# Patient Record
Sex: Male | Born: 1970 | Hispanic: Yes | Marital: Married | Smoking: Never smoker
Health system: Southern US, Community
[De-identification: ages and names within clinical notes are randomized; demographics above are authoritative.]

## PROBLEM LIST (undated history)

## (undated) DIAGNOSIS — E119 Type 2 diabetes mellitus without complications: Secondary | ICD-10-CM

## (undated) HISTORY — DX: Type 2 diabetes mellitus without complications: E11.9

## (undated) HISTORY — PX: KNEE SURGERY: SHX244

---

## 2006-01-06 ENCOUNTER — Emergency Department: Payer: Self-pay | Admitting: Emergency Medicine

## 2014-12-18 ENCOUNTER — Other Ambulatory Visit: Payer: Self-pay | Admitting: Specialist

## 2014-12-18 DIAGNOSIS — M222X1 Patellofemoral disorders, right knee: Secondary | ICD-10-CM

## 2014-12-26 ENCOUNTER — Ambulatory Visit
Admission: RE | Admit: 2014-12-26 | Discharge: 2014-12-26 | Disposition: A | Discharge status: Home / Self Care | Payer: BLUE CROSS/BLUE SHIELD | Source: Ambulatory Visit | Attending: Specialist | Admitting: Specialist

## 2014-12-26 DIAGNOSIS — M222X1 Patellofemoral disorders, right knee: Secondary | ICD-10-CM

## 2014-12-26 DIAGNOSIS — M9420 Chondromalacia, unspecified site: Secondary | ICD-10-CM | POA: Insufficient documentation

## 2019-10-15 ENCOUNTER — Other Ambulatory Visit: Payer: Self-pay

## 2019-10-15 ENCOUNTER — Ambulatory Visit
Admission: EM | Admit: 2019-10-15 | Discharge: 2019-10-15 | Disposition: A | Discharge status: Home / Self Care | Payer: BC Managed Care – PPO

## 2019-10-15 DIAGNOSIS — M25561 Pain in right knee: Secondary | ICD-10-CM | POA: Diagnosis not present

## 2019-10-15 MED ORDER — PREDNISONE 10 MG PO TABS: ORAL_TABLET | ORAL | 0 refills | Status: AC

## 2019-10-15 NOTE — ED Provider Notes (Signed)
[facility] ____________________________________________  Time seen: Approximately 3:39 PM  I have reviewed the triage vital signs and the nursing notes.   HISTORY  Chief Complaint Knee Pain   HPI Brandon Rivera is a 49 y.o. male past medical history of diabetes, with history of right knee arthroscopy with patellar tendinitis presenting for evaluation of right knee pain.  Patient reports he has not had right knee pain for the last few years, but reports for the last 2 weeks he has been having anterior right knee pain again.  States pain is to the front of his knee, particularly with activity.  Reports he works on [employer] units and does a lot of bending, crawling, going through crawl spaces and direct pressure to his knees.  Reports for the last 2 weeks he has been having the pain again.  States he did have some leftover meloxicam that he tried without resolution.  States pain is mild right now.  States feels like he may have fluid as it is tight with bending.  Denies any fall, direct injury or direct trauma.  Denies pain radiation, paresthesias or distal swelling.  Denies recent sickness or immobilization.  Reports otherwise doing well.    Past Medical History:  Diagnosis Date  . Diabetes mellitus without complication (HCC)     There are no problems to display for this patient.   Past Surgical History:  Procedure Laterality Date  . KNEE SURGERY       No current facility-administered medications for this encounter.  Current Outpatient Medications:  .  metFORMIN (GLUCOPHAGE) 1000 MG tablet, Take 1,000 mg by mouth 2 (two) times daily., Disp: , Rfl:  .  predniSONE (DELTASONE) 10 MG tablet, Start 60 mg po day one, then 50 mg po day two, taper by 10 mg daily until complete., Disp: 21 tablet, Rfl: 0  Allergies Patient has no known allergies.  History reviewed. No pertinent family history.  Social History Social History   Tobacco Use  . Smoking status: Never Smoker  .  Smokeless tobacco: Never Used  Substance Use Topics  . Alcohol use: Not Currently  . Drug use: Never    Review of Systems Constitutional: No fever Cardiovascular: Denies chest pain. Respiratory: Denies shortness of breath. Musculoskeletal: Positive right knee pain. Skin: Negative for rash. Neurological: Negative for focal weakness or numbness.    ____________________________________________   PHYSICAL EXAM:  VITAL SIGNS: ED Triage Vitals  Enc Vitals Group     BP 10/15/19 1520 119/83     Pulse Rate 10/15/19 1520 79     Resp 10/15/19 1520 16     Temp 10/15/19 1520 97.7 F (36.5 C)     Temp Source 10/15/19 1520 Oral     SpO2 10/15/19 1520 98 %     Weight 10/15/19 1519 163 lb (73.9 kg)     Height --      Head Circumference --      Peak Flow --      Pain Score 10/15/19 1519 5     Pain Loc --      Pain Edu? --      Excl. in [place]? --     Constitutional: Alert and oriented. Well appearing and in no acute distress. Eyes: Conjunctivae are normal. ENT      Head: Normocephalic and atraumatic. Cardiovascular:  Good peripheral circulation. Respiratory: Normal respiratory effort without tachypnea nor retractions. Musculoskeletal: Steady gait.  Bilateral distal pedal pulses equal and easily palpated. Except: Right anterior knee inferior to  patella tenderness to direct palpation, no effusion noted, no point bony tenderness, unable to fully extend and flex knee, no pain with anterior posterior drawer test, no medial lateral stress pain, right lower extremity otherwise nontender. Neurologic:  Normal speech and language.  Skin:  Skin is warm, dry and intact. No rash noted. Psychiatric: Mood and affect are normal. Speech and behavior are normal. Patient exhibits appropriate insight and judgment   ___________________________________________   LABS (all labs ordered are listed, but only abnormal results are displayed)  Labs Reviewed - No data to display   PROCEDURES Procedures      INITIAL IMPRESSION / ASSESSMENT AND PLAN / ED COURSE  Pertinent labs & imaging results that were available during my care of the patient were reviewed by me and considered in my medical decision making (see chart for details).  Well-appearing patient.  No acute distress.  Right knee pain present for the last 2 weeks.  Denies fall or direct injury.  History with similar presentation with noted patellar tendinitis with previous arthroscopy.  Suspect inflammation and patellar tendinitis.  As patient has had no direct trauma will defer x-ray at this time, patient agreed to plan.  Initially discussed managing with meloxicam, however patient reports this has not worked for him, will treat with prednisone, monitor blood sugar.  Strongly encourage patient to avoidance of aggravating activity in his line of work and to utilize frequent ice.  Follow-up with orthopedic for continued pain. Discussed indication, risks and benefits of medications with patient.   Discussed follow up with Primary care physician this week. Discussed follow up and return parameters including no resolution or any worsening concerns. Patient verbalized understanding and agreed to plan.   ____________________________________________   FINAL CLINICAL IMPRESSION(S) / ED DIAGNOSES  Final diagnoses:  Acute pain of right knee     ED Discharge Orders         Ordered    predniSONE (DELTASONE) 10 MG tablet     Discontinue  Reprint     10/15/19 1536           Note: This dictation was prepared with Dragon dictation along with smaller phrase technology. Any transcriptional errors that result from this process are unintentional.         Renford Dills, NP 10/15/19 1545

## 2019-10-15 NOTE — Discharge Instructions (Signed)
Take medication as prescribed. Rest. Ice.  Monitor blood sugar with medication.  After the prednisone, he may resume the meloxicam as needed.  Avoid repetitive movements as discussed, particularly avoid putting a lot of pressure on your knee.  Follow-up with orthopedic, for continued pain.  Follow up with your primary care physician this week as needed. Return to Urgent care for new or worsening concerns.

## 2019-10-15 NOTE — ED Triage Notes (Signed)
Pt with right knee pain. Had arthroscopy 4 or more years ago. A couple weeks ago it started hurting again but no known injury. Feels like it has fluid on it.

## 2021-05-27 ENCOUNTER — Emergency Department
Admission: EM | Admit: 2021-05-27 | Discharge: 2021-05-27 | Disposition: A | Discharge status: Home / Self Care | Payer: PRIVATE HEALTH INSURANCE | Attending: Emergency Medicine | Admitting: Emergency Medicine

## 2021-05-27 ENCOUNTER — Emergency Department: Payer: PRIVATE HEALTH INSURANCE

## 2021-05-27 ENCOUNTER — Other Ambulatory Visit: Payer: Self-pay

## 2021-05-27 DIAGNOSIS — S61011A Laceration without foreign body of right thumb without damage to nail, initial encounter: Secondary | ICD-10-CM | POA: Diagnosis present

## 2021-05-27 DIAGNOSIS — Z23 Encounter for immunization: Secondary | ICD-10-CM | POA: Insufficient documentation

## 2021-05-27 DIAGNOSIS — Y990 Civilian activity done for income or pay: Secondary | ICD-10-CM | POA: Insufficient documentation

## 2021-05-27 DIAGNOSIS — W311XXA Contact with metalworking machines, initial encounter: Secondary | ICD-10-CM | POA: Diagnosis not present

## 2021-05-27 MED ORDER — CEPHALEXIN 500 MG PO CAPS: 1000.0000 mg | ORAL_CAPSULE | Freq: Two times a day (BID) | ORAL | 0 refills | Status: AC

## 2021-05-27 MED ORDER — LIDOCAINE HCL (PF) 1 % IJ SOLN
10.0000 mL | Freq: Once | INTRAMUSCULAR | Status: AC
  Administered 2021-05-27: 10 mL
  Filled 2021-05-27: qty 10

## 2021-05-27 MED ORDER — CEPHALEXIN 500 MG PO CAPS: 1000.0000 mg | ORAL_CAPSULE | Freq: Two times a day (BID) | ORAL | 0 refills | Status: DC

## 2021-05-27 MED ORDER — TETANUS-DIPHTH-ACELL PERTUSSIS 5-2.5-18.5 LF-MCG/0.5 IM SUSY
0.5000 mL | PREFILLED_SYRINGE | Freq: Once | INTRAMUSCULAR | Status: AC
  Administered 2021-05-27: 0.5 mL via INTRAMUSCULAR
  Filled 2021-05-27: qty 0.5

## 2021-05-27 NOTE — ED Triage Notes (Signed)
Lac to right thumb at work. Bleeding controlled at this time

## 2021-05-27 NOTE — ED Provider Notes (Incomplete)
[facility] Provider Note  Patient Contact: 4:46 PM (approximate)   History   Laceration   HPI  Brandon Rivera is a 51 y.o. male  ***       Physical Exam   Triage Vital Signs: ED Triage Vitals  Enc Vitals Group     BP 05/27/21 1544 136/90     Pulse Rate 05/27/21 1544 94     Resp 05/27/21 1544 17     Temp 05/27/21 1544 98.4 F (36.9 C)     Temp src --      SpO2 05/27/21 1544 97 %     Weight 05/27/21 1547 157 lb (71.2 kg)     Height --      Head Circumference --      Peak Flow --      Pain Score 05/27/21 1546 7     Pain Loc --      Pain Edu? --      Excl. in [place]? --     Most recent vital signs: Vitals:   05/27/21 1544  BP: 136/90  Pulse: 94  Resp: 17  Temp: 98.4 F (36.9 C)  SpO2: 97%     General: Alert and in no acute distress. {**Eyes:  PERRL. EOMI.**} {**Head: No acute traumatic findings**} {**ENT:      Ears:       Nose: No congestion/rhinnorhea.      Mouth/Throat: Mucous membranes are moist.**}  {**Neck: No stridor. No cervical spine tenderness to palpation.**} {**Hematological/Lymphatic/Immunilogical: No cervical lymphadenopathy.**}  Cardiovascular:  Good peripheral perfusion Respiratory: Normal respiratory effort without tachypnea or retractions. Lungs CTAB. {**Good air entry to the bases with no decreased or absent breath sounds.**} {**Gastrointestinal: Bowel sounds 4 quadrants. Soft and nontender to palpation. No guarding or rigidity. No palpable masses. No distention. No CVA tenderness.**} Musculoskeletal: Full range of motion to all extremities.  Neurologic:  No gross focal neurologic deficits are appreciated.  Skin:   No rash noted Other:   ED Results / Procedures / Treatments   Labs (all labs ordered are listed, but only abnormal results are displayed) Labs Reviewed - No data to display   EKG  ***   RADIOLOGY  {**I personally viewed and evaluated these images as part of my medical decision making, as  well as reviewing the written report by the radiologist.  ED Provider Interpretation: **}  No results found.  PROCEDURES:  Critical Care performed: {CriticalCareYesNo:19197::"Yes, see critical care procedure note(s)","No"}  Procedures   MEDICATIONS ORDERED IN ED: Medications  Tdap (BOOSTRIX) injection 0.5 mL (has no administration in time range)     IMPRESSION / MDM / ASSESSMENT AND PLAN / ED COURSE  I reviewed the triage vital signs and the nursing notes.                              Differential diagnosis includes, but is not limited to, ***  {**The patient is on the cardiac monitor to evaluate for evidence of arrhythmia and/or significant heart rate changes.**}   Patient's diagnosis is consistent with ***. Patient will be discharged home with prescriptions for ***. Patient is to follow up with *** as needed or otherwise directed. Patient is given ED precautions to return to the ED for any worsening or new symptoms.  {Remember to include, when applicable, any/all of the following data: independent review of imaging independent review of labs (comment specifically on pertinent positives and negatives) review of  specific prior hospitalizations, PCP/specialist notes, etc. discuss meds given and prescribed document any discussion with consultants (including hospitalists) any clinical decision tools you used and why (PECARN, NEXUS, etc.) did you consider admitting the patient? document social determinants of health affecting patient's care (homelessness, inability to follow up in a timely fashion, etc) document any pre-existing conditions increasing risk on current visit (e.g. diabetes and HTN increasing danger of high-risk chest pain/ACS) describes what meds you gave (especially parenteral) and why any other interventions?:1}      FINAL CLINICAL IMPRESSION(S) / ED DIAGNOSES   Final diagnoses:  None     Rx / DC Orders   ED Discharge Orders     None         Note:  This document was prepared using Dragon voice recognition software and may include unintentional dictation errors.

## 2021-05-27 NOTE — ED Provider Notes (Signed)
[facility] Provider Note  Patient Contact: 6:13 PM (approximate)   History   Laceration   HPI  Brandon Rivera is a 51 y.o. male who presents the emergency department complaining of a laceration to the right thumb.  Patient was using a saws all at work when the blade broke.  This caused a laceration to the distal part of his thumb.  Full range of motion is preserved.  No other injury or complaint.  Unsure his last tetanus shot.     Physical Exam   Triage Vital Signs: ED Triage Vitals  Enc Vitals Group     BP 05/27/21 1544 136/90     Pulse Rate 05/27/21 1544 94     Resp 05/27/21 1544 17     Temp 05/27/21 1544 98.4 F (36.9 C)     Temp src --      SpO2 05/27/21 1544 97 %     Weight 05/27/21 1547 157 lb (71.2 kg)     Height --      Head Circumference --      Peak Flow --      Pain Score 05/27/21 1546 7     Pain Loc --      Pain Edu? --      Excl. in [place]? --     Most recent vital signs: Vitals:   05/27/21 1544  BP: 136/90  Pulse: 94  Resp: 17  Temp: 98.4 F (36.9 C)  SpO2: 97%     General: Alert and in no acute distress.  Cardiovascular:  Good peripheral perfusion Respiratory: Normal respiratory effort without tachypnea or retractions. Lungs CTAB.  Musculoskeletal: Full range of motion to all extremities.  Laceration Noted to the Right Thumb.  Edge of the Laceration Measures Approximately 1.5 Cm in Length.  No Active Bleeding.  No Visible Foreign Body.  Good Range Of Motion to the Thumb.  Sensation Capillary Refill Intact. Neurologic:  No gross focal neurologic deficits are appreciated.  Skin:   No rash noted Other:   ED Results / Procedures / Treatments   Labs (all labs ordered are listed, but only abnormal results are displayed) Labs Reviewed - No data to display   EKG     RADIOLOGY  I personally viewed and evaluated these images as part of my medical decision making, as well as reviewing the written report by the  radiologist.  ED Provider Interpretation: Soft tissue injury identified on imaging but no retained foreign body or injury to the osseous structures are identified.  DG Hand Complete Right  Result Date: 05/27/2021 CLINICAL DATA:  Laceration to thumb.  Saw injury. EXAM: RIGHT HAND - COMPLETE 3+ VIEW COMPARISON:  None. FINDINGS: Negative for fracture or arthropathy. Laceration distal thumb.  No foreign body identified. IMPRESSION: Laceration of the thumb.  Negative for fracture. Electronically Signed   By: Franchot Gallo M.D.   On: 05/27/2021 17:07    PROCEDURES:  Critical Care performed: No  ..Laceration Repair  Date/Time: 05/27/2021 7:36 PM Performed by: Darletta Moll, PA-C Authorized by: Darletta Moll, PA-C   Consent:    Consent obtained:  Verbal   Consent given by:  Patient   Risks discussed:  Infection, pain and poor wound healing Universal protocol:    Procedure explained and questions answered to patient or proxy's satisfaction: yes     Immediately prior to procedure, a time out was called: yes     Patient identity confirmed:  Verbally with patient Anesthesia:  Anesthesia method:  Nerve block   Block location:  R thumb   Block needle gauge:  27 G   Block anesthetic:  Lidocaine 1% w/o epi   Block technique:  Digital block   Block injection procedure:  Anatomic landmarks identified, introduced needle, negative aspiration for blood and incremental injection   Block outcome:  Anesthesia achieved Laceration details:    Location:  Finger   Finger location:  R thumb   Length (cm):  1.5 Pre-procedure details:    Preparation:  Patient was prepped and draped in usual sterile fashion and imaging obtained to evaluate for foreign bodies Exploration:    Hemostasis achieved with:  Direct pressure   Imaging obtained: x-ray     Imaging outcome: foreign body not noted     Wound exploration: wound explored through full range of motion and entire depth of wound visualized      Wound extent: no foreign bodies/material noted, no nerve damage noted, no tendon damage noted, no underlying fracture noted and no vascular damage noted     Contaminated: yes   Treatment:    Area cleansed with:  Povidone-iodine and saline   Amount of cleaning:  Extensive   Irrigation solution:  Sterile saline   Irrigation volume:  1 L   Irrigation method:  Syringe Skin repair:    Repair method:  Sutures   Suture size:  4-0   Suture material:  Nylon   Suture technique:  Simple interrupted   Number of sutures:  5 Approximation:    Approximation:  Close Repair type:    Repair type:  Intermediate Post-procedure details:    Dressing:  Open (no dressing)   Procedure completion:  Tolerated well, no immediate complications   MEDICATIONS ORDERED IN ED: Medications  lidocaine (PF) (XYLOCAINE) 1 % injection 10 mL (has no administration in time range)  Tdap (BOOSTRIX) injection 0.5 mL (0.5 mLs Intramuscular Given 05/27/21 1650)     IMPRESSION / MDM / ASSESSMENT AND PLAN / ED COURSE  I reviewed the triage vital signs and the nursing notes.                              Differential diagnosis includes, but is not limited to, finger laceration, tendon injury, underlying fracture, retained foreign body.   Patient's diagnosis is consistent with finger laceration.  Patient presented to the emergency department after sustaining a laceration to the right thumb.  This was closed as described above.  Imaging revealed no retained foreign body or trauma to the osseous structures.  Wound care instructions discussed at length with the patient.  Patiently placed on antibiotics prophylactically simply.  Follow-up with urgent care or primary care in 1 week for suture removal.. Patient is given ED precautions to return to the ED for any worsening or new symptoms.        FINAL CLINICAL IMPRESSION(S) / ED DIAGNOSES   Final diagnoses:  Laceration of right thumb without foreign body without damage  to nail, initial encounter     Rx / DC Orders   ED Discharge Orders     None        Note:  This document was prepared using Dragon voice recognition software and may include unintentional dictation errors.   Brynda Peon 05/27/21 1939    Vanessa [place], MD 05/27/21 2009

## 2022-09-29 IMAGING — DX DG HAND COMPLETE 3+V*R*
3 series · 3 of 3 positions shown · non-contrast
Comparison: None.

CLINICAL DATA: Laceration to thumb.  Saw injury.

EXAM:
RIGHT HAND - COMPLETE 3+ VIEW

[hand ap]
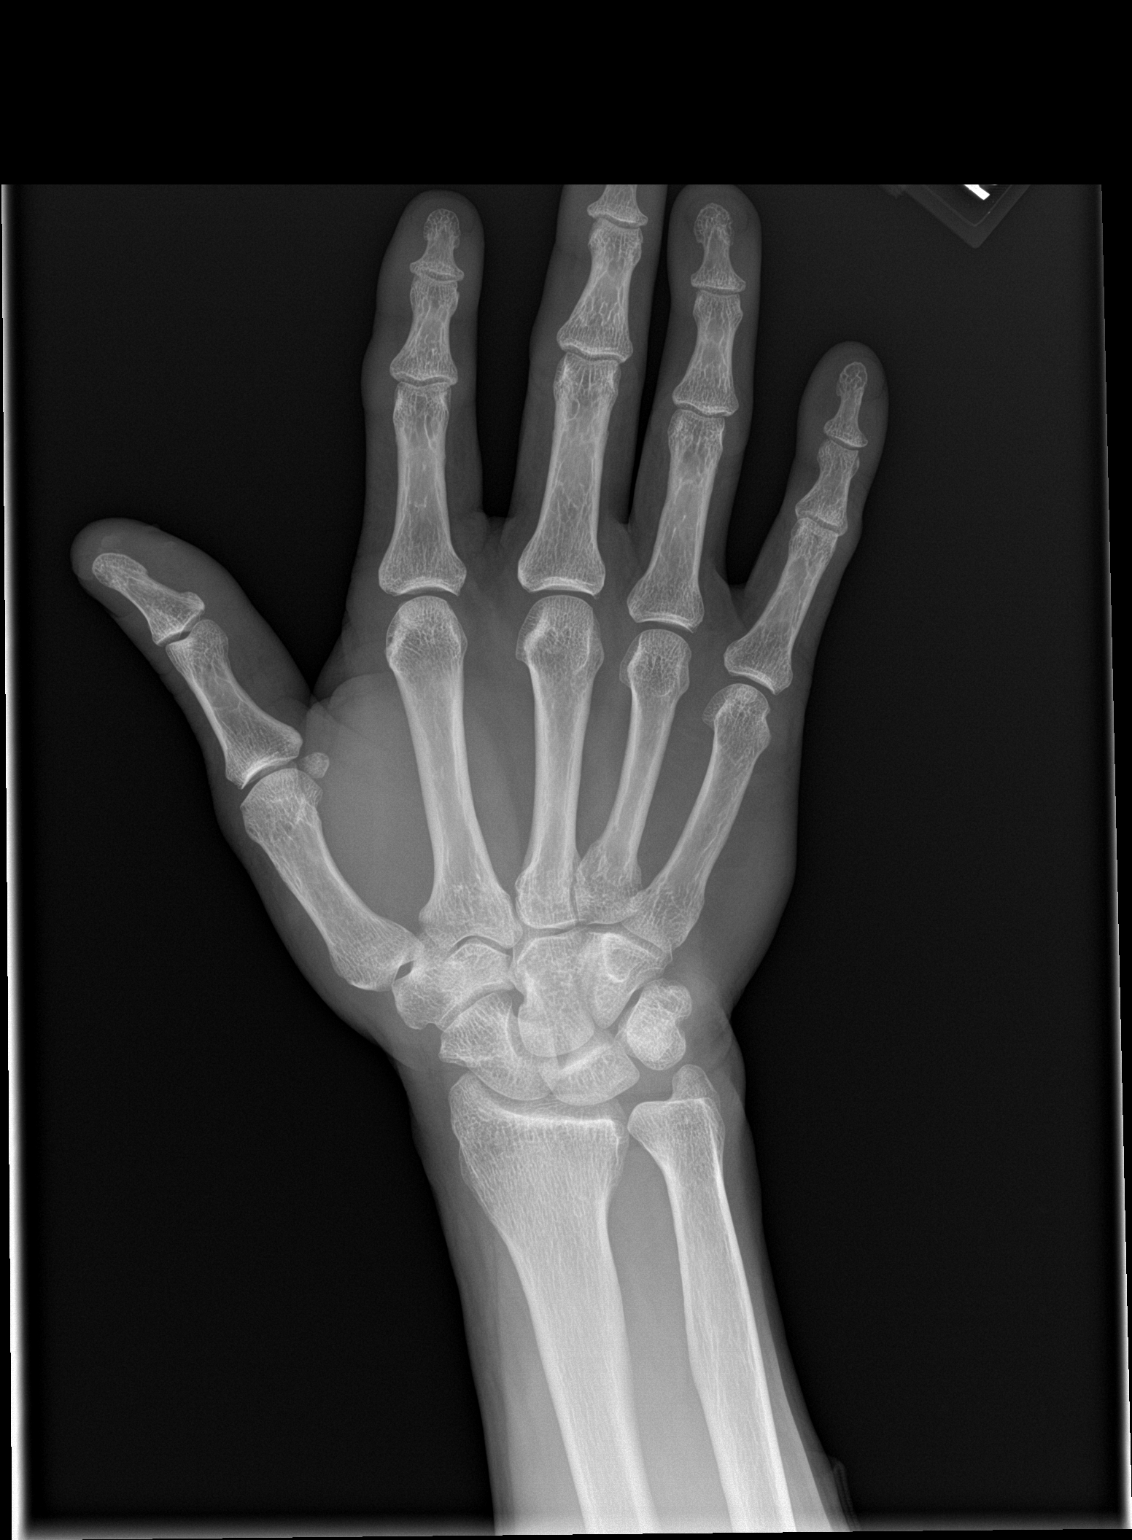

[hand obl]
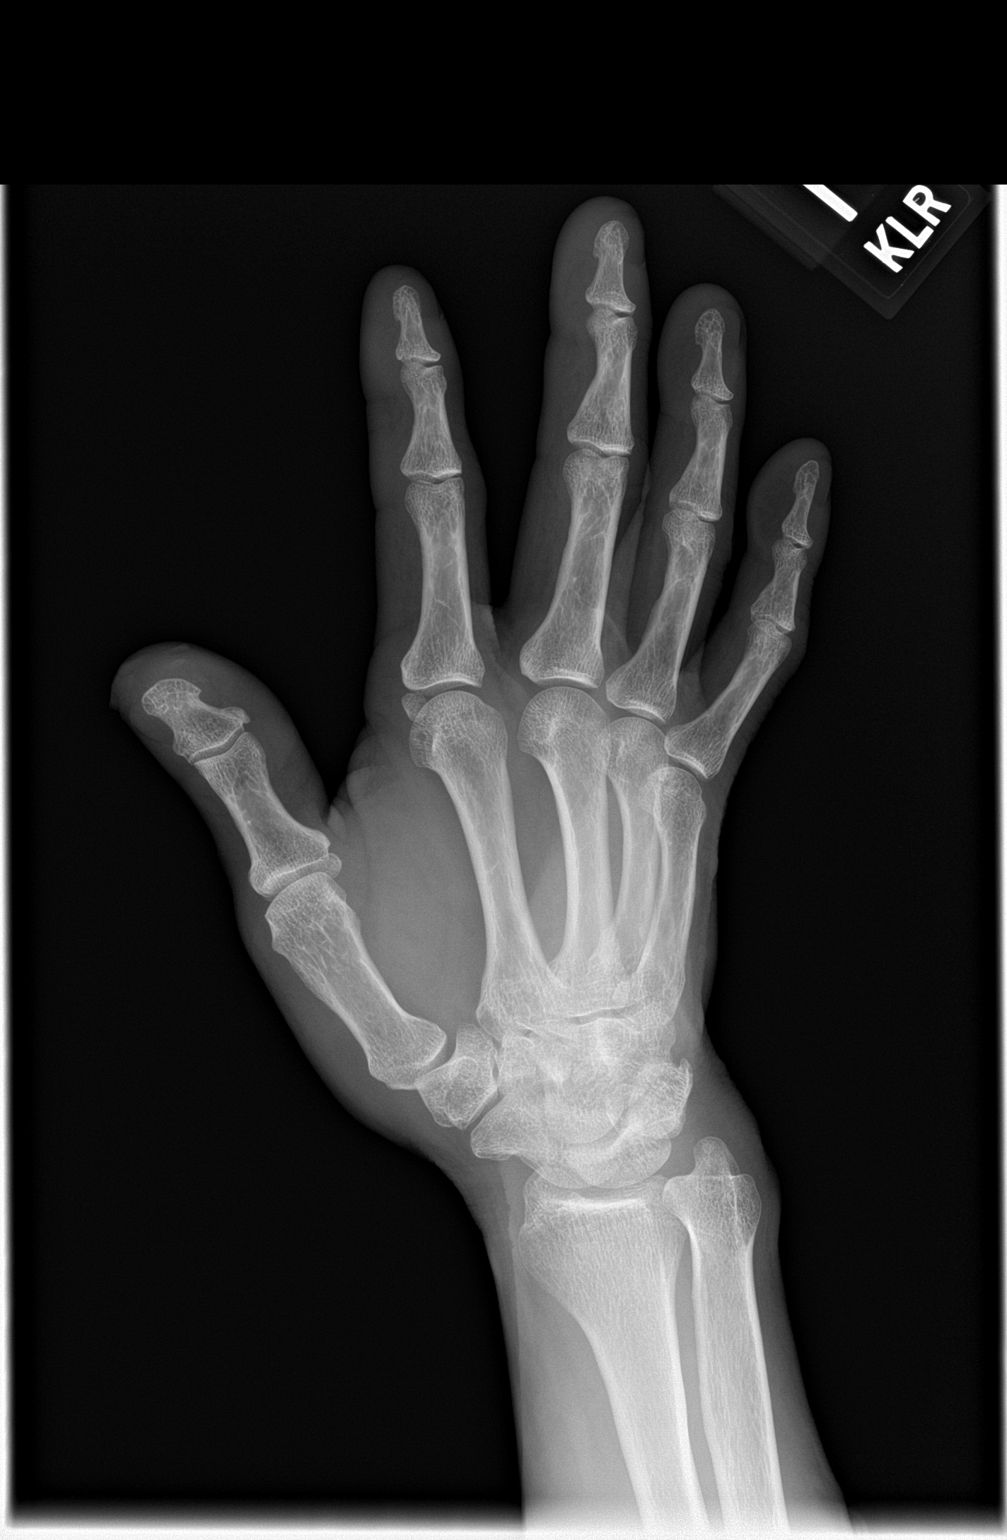

[hand lat]
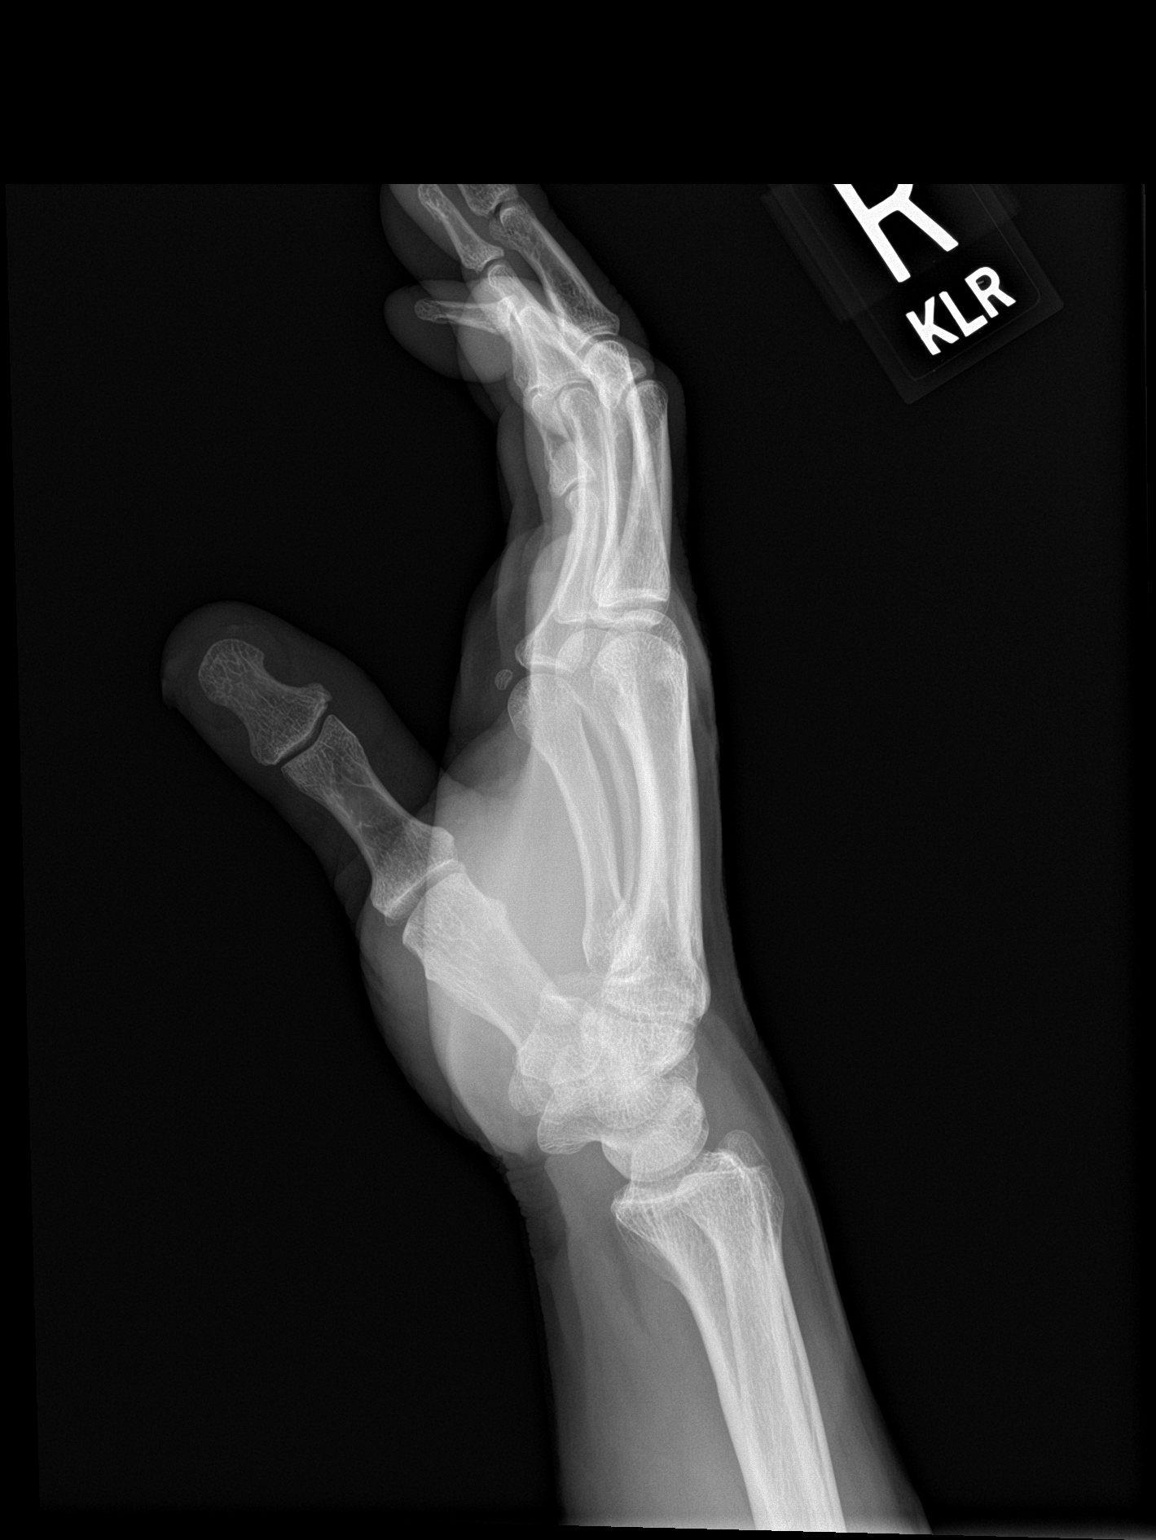

[3 of 3 positions shown; findings below may reference images not displayed]

FINDINGS: Negative for fracture or arthropathy.

Laceration distal thumb.  No foreign body identified.
IMPRESSION: Laceration of the thumb.  Negative for fracture.
